# Patient Record
Sex: Female | Born: 1960 | Race: White | Hispanic: No | Marital: Single | Smoking: Current every day smoker
Health system: Southern US, Community
[De-identification: ages and names within clinical notes are randomized; demographics above are authoritative.]

## PROBLEM LIST (undated history)

## (undated) ENCOUNTER — Ambulatory Visit: Admission: EM | Discharge status: Absent without leave | Payer: Self-pay

## (undated) DIAGNOSIS — I10 Essential (primary) hypertension: Secondary | ICD-10-CM

---

## 2017-01-29 ENCOUNTER — Encounter: Payer: Self-pay | Admitting: Emergency Medicine

## 2017-01-29 ENCOUNTER — Emergency Department
Admission: EM | Admit: 2017-01-29 | Discharge: 2017-01-29 | Disposition: A | Discharge status: Home / Self Care | Payer: Self-pay | Attending: Emergency Medicine | Admitting: Emergency Medicine

## 2017-01-29 ENCOUNTER — Emergency Department: Payer: Self-pay

## 2017-01-29 DIAGNOSIS — J40 Bronchitis, not specified as acute or chronic: Secondary | ICD-10-CM | POA: Insufficient documentation

## 2017-01-29 DIAGNOSIS — I10 Essential (primary) hypertension: Secondary | ICD-10-CM | POA: Insufficient documentation

## 2017-01-29 DIAGNOSIS — J111 Influenza due to unidentified influenza virus with other respiratory manifestations: Secondary | ICD-10-CM | POA: Insufficient documentation

## 2017-01-29 DIAGNOSIS — F1290 Cannabis use, unspecified, uncomplicated: Secondary | ICD-10-CM | POA: Insufficient documentation

## 2017-01-29 DIAGNOSIS — F17210 Nicotine dependence, cigarettes, uncomplicated: Secondary | ICD-10-CM | POA: Insufficient documentation

## 2017-01-29 HISTORY — DX: Essential (primary) hypertension: I10

## 2017-01-29 MED ORDER — HYDROCOD POLST-CPM POLST ER 10-8 MG/5ML PO SUER
5.0000 mL | Freq: Once | ORAL | Status: AC
  Administered 2017-01-29: 5 mL via ORAL

## 2017-01-29 MED ORDER — HYDROCOD POLST-CPM POLST ER 10-8 MG/5ML PO SUER
ORAL | Status: AC
  Administered 2017-01-29: 5 mL via ORAL
  Filled 2017-01-29: qty 5

## 2017-01-29 MED ORDER — HYDROCOD POLST-CPM POLST ER 10-8 MG/5ML PO SUER: 5.0000 mL | Freq: Two times a day (BID) | ORAL | 0 refills | Status: AC | PRN

## 2017-01-29 NOTE — ED Provider Notes (Signed)
[facility] Emergency Department Provider Note    First MD Initiated Contact with Patient 01/29/17 [phone]     (approximate)  I have reviewed the triage vital signs and the nursing notes.   HISTORY  Chief Complaint Fever; Chills; and Cough   HPI Alyssa Anderson is a 56 y.o. female with history of hypertension presents to the emergency department with three-day history of generalized myalgia cough congestion chills and fever. Patient afebrile on presentation temperature 99.5. Patient does admit to half a pack tobacco use daily. Patient denies any recent known sick contact   Past Medical History:  Diagnosis Date  . Hypertension     There are no active problems to display for this patient.   Past surgical history No pertinent past surgical history  Prior to Admission medications   Not on File    Allergies Patient has no known allergies.  History reviewed. No pertinent family history.  Social History Social History  Substance Use Topics  . Smoking status: Current Every Day Smoker    Types: Cigarettes  . Smokeless tobacco: Never Used  . Alcohol use No    Review of Systems Constitutional: Positive for fever/chills Eyes: No visual changes. ENT: No sore throat. Cardiovascular: Denies chest pain. Respiratory: Denies shortness of breath. Positive cough Gastrointestinal: No abdominal pain.  No nausea, no vomiting.  No diarrhea.  No constipation. Genitourinary: Negative for dysuria. Musculoskeletal: Negative for back pain. Positive for generalized muscle pain Skin: Negative for rash. Neurological: Negative for headaches, focal weakness or numbness.  10-point ROS otherwise negative.  ____________________________________________   PHYSICAL EXAM:  VITAL SIGNS: ED Triage Vitals  Enc Vitals Group     BP 01/29/17 0411 139/68     Pulse Rate 01/29/17 0411 83     Resp 01/29/17 0411 15     Temp 01/29/17 0411 99.5 F (37.5 C)     Temp Source  01/29/17 0411 Oral     SpO2 01/29/17 0411 98 %     Weight 01/29/17 0412 142 lb (64.4 kg)     Height 01/29/17 0412 5\' 1"  (1.549 m)     Head Circumference --      Peak Flow --      Pain Score --      Pain Loc --      Pain Edu? --      Excl. in GC? --     Constitutional: Alert and oriented. Well appearing and in no acute distress. Eyes: Conjunctivae are normal. PERRL. EOMI. Head: Atraumatic. Ears:  Healthy appearing ear canals and TMs bilaterally Nose: No congestion/rhinnorhea. Mouth/Throat: Mucous membranes are moist. Oropharynx non-erythematous. Neck: No stridor.  No meningeal signs.  Cardiovascular: Normal rate, regular rhythm. Good peripheral circulation. Grossly normal heart sounds. Respiratory: Normal respiratory effort.  No retractions. Lungs CTAB. Gastrointestinal: Soft and nontender. No distention.  Musculoskeletal: No lower extremity tenderness nor edema. No gross deformities of extremities. Neurologic:  Normal speech and language. No gross focal neurologic deficits are appreciated.  Skin:  Skin is warm, dry and intact. No rash noted. Psychiatric: Mood and affect are normal. Speech and behavior are normal.   RADIOLOGY I, Marion N BROWN, personally viewed and evaluated these images (plain radiographs) as part of my medical decision making, as well as reviewing the written report by the radiologist.  Dg Chest 2 View  Result Date: 01/29/2017 CLINICAL DATA:  Cough, fever, chills, and nausea for 3 days. History of hypertension. EXAM: CHEST  2 VIEW COMPARISON:  None. FINDINGS:  The heart size and mediastinal contours are within normal limits. Both lungs are clear. The visualized skeletal structures are unremarkable. IMPRESSION: No active cardiopulmonary disease. Electronically Signed   By: Burman NievesWilliam  Stevens M.D.   On: 01/29/2017 06:17      Procedures     INITIAL IMPRESSION / ASSESSMENT AND PLAN / ED COURSE  Pertinent labs & imaging results that were available during my  care of the patient were reviewed by me and considered in my medical decision making (see chart for details).  Spoke with patient length regarding possibility of influenza versus bronchitis. Patient is beyond the therapeutic window for Tamiflu.      ____________________________________________  FINAL CLINICAL IMPRESSION(S) / ED DIAGNOSES  Final diagnoses:  Bronchitis  Influenza     MEDICATIONS GIVEN DURING THIS VISIT:  Medications  chlorpheniramine-HYDROcodone (TUSSIONEX) 10-8 MG/5ML suspension 5 mL (5 mLs Oral Given 01/29/17 0649)     NEW OUTPATIENT MEDICATIONS STARTED DURING THIS VISIT:  New Prescriptions   No medications on file    Modified Medications   No medications on file    Discontinued Medications   No medications on file     Note:  This document was prepared using Dragon voice recognition software and may include unintentional dictation errors.    Darci Currentandolph N Brown, MD 01/29/17 [id number]

## 2017-01-29 NOTE — ED Triage Notes (Signed)
Pt arrived to triage by EMS, form home, with c/o fever, chills nausea and cough x3 days. Pt denies V/D. Pt is 99.34F in triage, A&Ox4.

## 2017-01-29 NOTE — ED Notes (Signed)
ED Provider at bedside. 

## 2017-01-29 NOTE — ED Notes (Signed)
Patient transported to X-ray 

## 2018-03-11 IMAGING — CR DG CHEST 2V
2 series · 2 of 2 positions shown · non-contrast
Comparison: None.

CLINICAL DATA: Cough, fever, chills, and nausea for 3 days. History
of hypertension.

EXAM:
CHEST  2 VIEW

[chest pa]
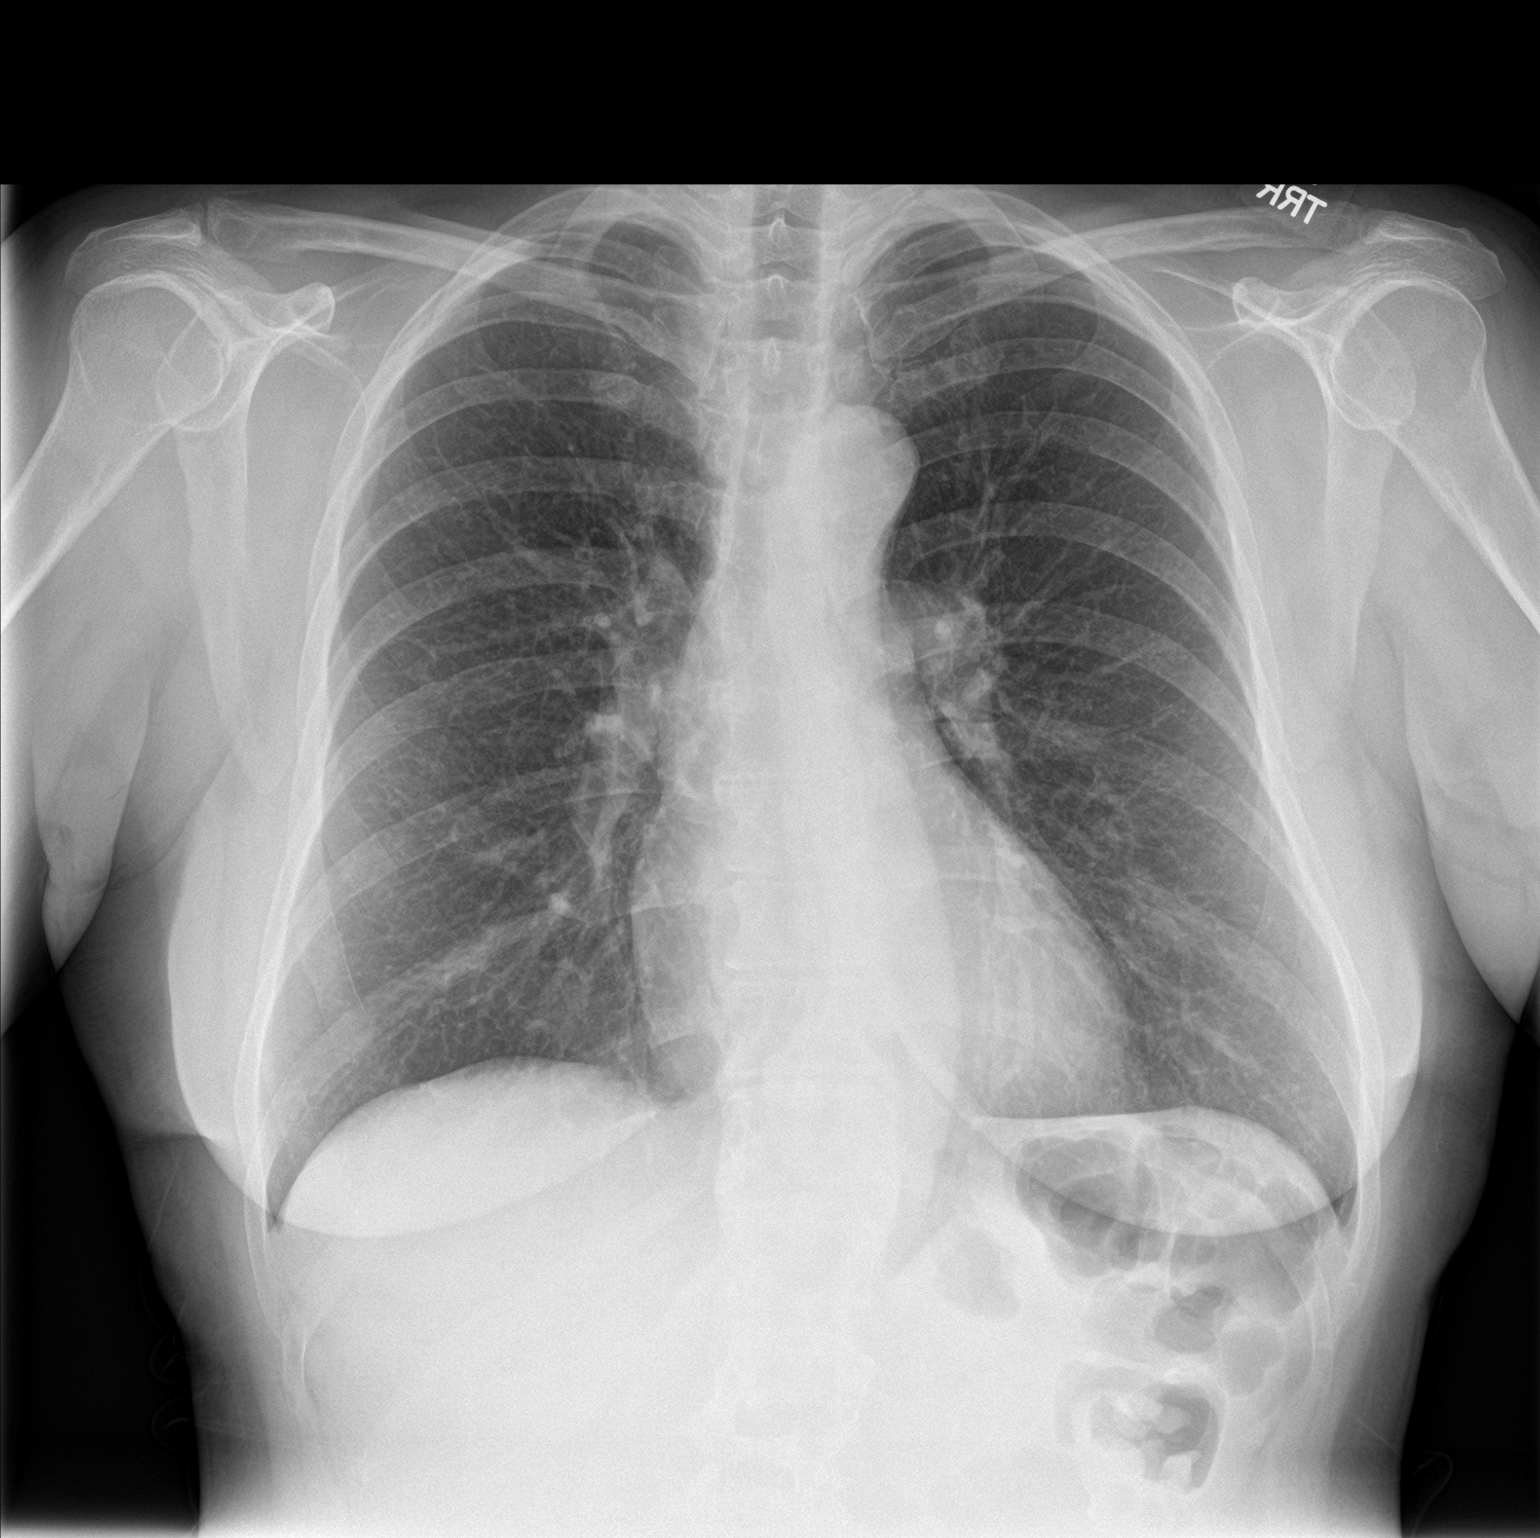

[chest lat]
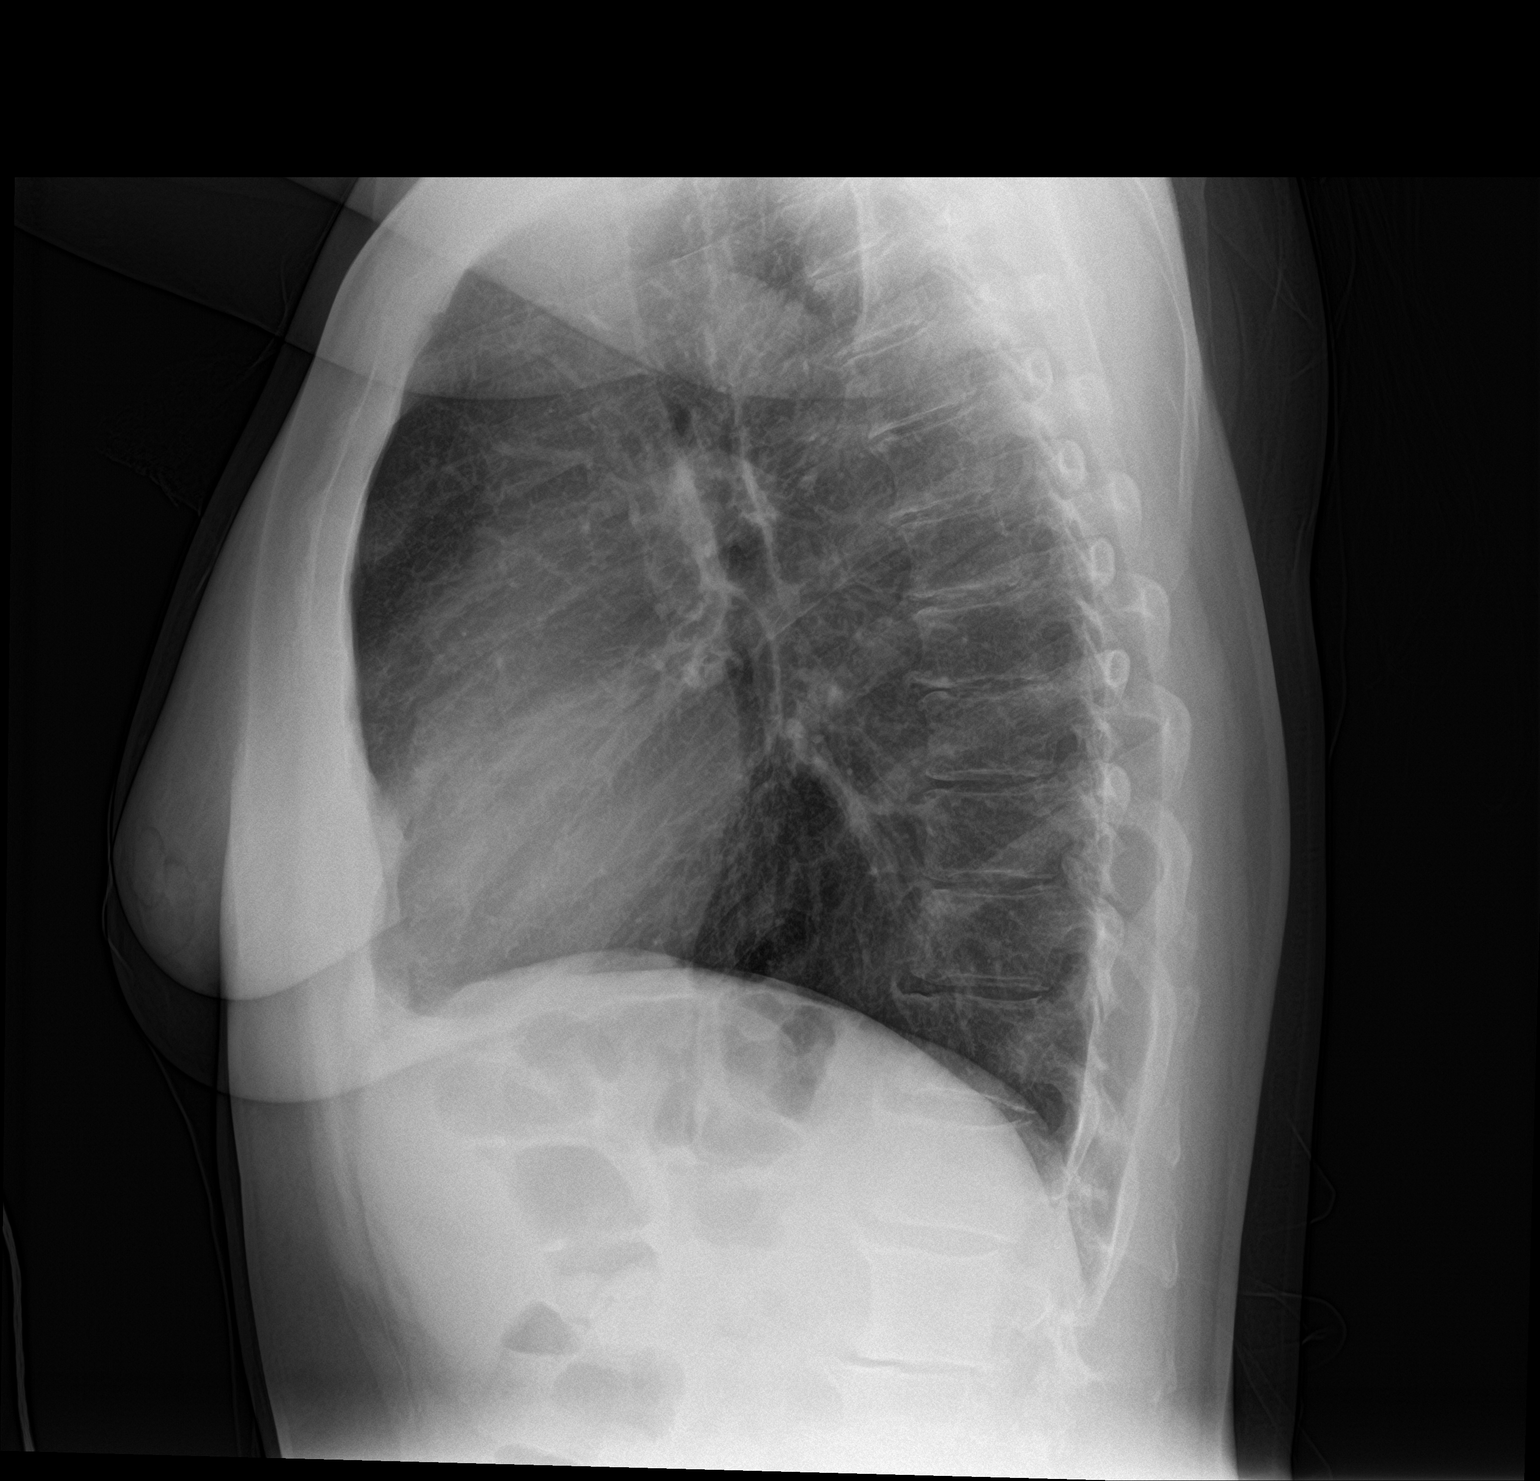

[2 of 2 positions shown; findings below may reference images not displayed]

FINDINGS: The heart size and mediastinal contours are within normal limits.
Both lungs are clear. The visualized skeletal structures are
unremarkable.
IMPRESSION: No active cardiopulmonary disease.

## 2021-06-01 ENCOUNTER — Other Ambulatory Visit: Payer: Self-pay | Admitting: Neurological Surgery
